# Patient Record
Sex: Male | Born: 1950
Health system: Southern US, Community
[De-identification: ages and names within clinical notes are randomized; demographics above are authoritative.]

## PROBLEM LIST (undated history)

## (undated) DIAGNOSIS — M1990 Unspecified osteoarthritis, unspecified site: Secondary | ICD-10-CM

## (undated) HISTORY — DX: Unspecified osteoarthritis, unspecified site: M19.90

---

## 2019-03-29 ENCOUNTER — Other Ambulatory Visit: Payer: Self-pay

## 2019-03-29 ENCOUNTER — Ambulatory Visit (INDEPENDENT_AMBULATORY_CARE_PROVIDER_SITE_OTHER): Payer: PPO | Admitting: Emergency Medicine

## 2019-03-29 ENCOUNTER — Ambulatory Visit (INDEPENDENT_AMBULATORY_CARE_PROVIDER_SITE_OTHER): Payer: PPO

## 2019-03-29 ENCOUNTER — Encounter: Payer: Self-pay | Admitting: Emergency Medicine

## 2019-03-29 VITALS — BP 168/87 | HR 109 | Temp 98.9°F | Resp 16 | Ht 69.0 in | Wt 156.0 lb

## 2019-03-29 DIAGNOSIS — M7989 Other specified soft tissue disorders: Secondary | ICD-10-CM

## 2019-03-29 DIAGNOSIS — M79642 Pain in left hand: Secondary | ICD-10-CM | POA: Diagnosis not present

## 2019-03-29 DIAGNOSIS — S6722XA Crushing injury of left hand, initial encounter: Secondary | ICD-10-CM

## 2019-03-29 MED ORDER — MELOXICAM 15 MG PO TABS: 15.0000 mg | ORAL_TABLET | Freq: Every day | ORAL | 0 refills | Status: AC

## 2019-03-29 NOTE — Progress Notes (Signed)
Garrett Ross 68 y.o.   Chief Complaint  Patient presents with  . Hand Injury    LEFT x 1 month ago with swelling and pain    HISTORY OF PRESENT ILLNESS: This is a 68 y.o. male sustained injury to left hand about 4 weeks ago complaining of pain and swelling.  No other injuries or significant symptoms.  HPI   Prior to Admission medications   Medication Sig Start Date End Date Taking? Authorizing Provider  ibuprofen (ADVIL) 200 MG tablet Take 200 mg by mouth every 6 (six) hours as needed.   Yes [provider]    No Known Allergies  There are no active problems to display for this patient.   History reviewed. No pertinent past medical history.  History reviewed. No pertinent surgical history.  Social History   Socioeconomic History  . Marital status: Single    Spouse name: Not on file  . Number of children: Not on file  . Years of education: Not on file  . Highest education level: Not on file  Occupational History  . Not on file  Social Needs  . Financial resource strain: Not on file  . Food insecurity    Worry: Not on file    Inability: Not on file  . Transportation needs    Medical: Not on file    Non-medical: Not on file  Tobacco Use  . Smoking status: Current Every Day Smoker    Packs/day: 0.50    Years: 60.00    Pack years: 30.00    Types: Cigarettes  . Smokeless tobacco: Never Used  Substance and Sexual Activity  . Alcohol use: Not on file  . Drug use: Never  . Sexual activity: Not on file  Lifestyle  . Physical activity    Days per week: Not on file    Minutes per session: Not on file  . Stress: Not on file  Relationships  . Social Musician on phone: Not on file    Gets together: Not on file    Attends religious service: Not on file    Active member of club or organization: Not on file    Attends meetings of clubs or organizations: Not on file    Relationship status: Not on file  . Intimate partner violence    Fear of  current or ex partner: Not on file    Emotionally abused: Not on file    Physically abused: Not on file    Forced sexual activity: Not on file  Other Topics Concern  . Not on file  Social History Narrative  . Not on file    History reviewed. No pertinent family history.   Review of Systems  Constitutional: Negative.  Negative for chills and fever.  HENT: Negative.  Negative for congestion and sore throat.   Respiratory: Negative.  Negative for cough and shortness of breath.   Cardiovascular: Negative.  Negative for chest pain and palpitations.  Gastrointestinal: Negative.  Negative for abdominal pain, diarrhea, nausea and vomiting.  Genitourinary: Negative.   Skin: Negative.  Negative for rash.  Neurological: Negative.  Negative for dizziness and headaches.  All other systems reviewed and are negative.  Today's Vitals   03/29/19 1519  BP: (!) 168/87  Pulse: (!) 109  Resp: 16  Temp: 98.9 F (37.2 C)  TempSrc: Oral  SpO2: 98%  Weight: 156 lb (70.8 kg)  Height: 5\' 9"  (1.753 m)   Body mass index is 23.04 kg/m.  Physical Exam Vitals signs reviewed.  Constitutional:      Appearance: Normal appearance.  HENT:     Head: Normocephalic.  Eyes:     Extraocular Movements: Extraocular movements intact.  Neck:     Musculoskeletal: Normal range of motion.  Cardiovascular:     Rate and Rhythm: Normal rate.  Pulmonary:     Effort: Pulmonary effort is normal.  Musculoskeletal:     Comments: Left hand: Positive swelling and tenderness dorsally.  Neurovascularly intact.  Decreased range of motion due to swelling  Skin:    General: Skin is warm and dry.     Capillary Refill: Capillary refill takes less than 2 seconds.  Neurological:     General: No focal deficit present.     Mental Status: He is alert and oriented to person, place, and time.  Psychiatric:        Mood and Affect: Mood normal.        Behavior: Behavior normal.    Dg Hand Complete Left  Result Date:  03/29/2019 CLINICAL DATA:  Crush injury to the left hand EXAM: LEFT HAND - COMPLETE 3+ VIEW COMPARISON:  None. FINDINGS: There is no evidence of fracture or dislocation. There is no evidence of arthropathy or other focal bone abnormality. Soft tissues are unremarkable. IMPRESSION: No fracture or dislocation. Electronically Signed   By: Delbert Phenix M.D.   On: 03/29/2019 15:57     ASSESSMENT & PLAN: Garrett Ross was seen today for hand injury.  Diagnoses and all orders for this visit:  Crushing injury of left hand, initial encounter -     DG Hand Complete Left; Future -     Ambulatory referral to Orthopedic Surgery  Left hand pain -     Ambulatory referral to Orthopedic Surgery -     meloxicam (MOBIC) 15 MG tablet; Take 1 tablet (15 mg total) by mouth daily for 10 days.  Swelling of left hand -     Ambulatory referral to Orthopedic Surgery -     meloxicam (MOBIC) 15 MG tablet; Take 1 tablet (15 mg total) by mouth daily for 10 days.    Patient Instructions       If you have lab work done today you will be contacted with your lab results within the next 2 weeks.  If you have not heard from [place] then please contact us. The fastest way to get your results is to register for My Chart.   IF you received an x-ray today, you will receive an invoice from [facility]. Please contact [facility] at [phone] with questions or concerns regarding your invoice.   IF you received labwork today, you will receive an invoice from [facility]. Please contact [facility] at [phone] with questions or concerns regarding your invoice.   Our billing staff will not be able to assist you with questions regarding bills from these companies.  You will be contacted with the lab results as soon as they are available. The fastest way to get your results is to activate your My Chart account. Instructions are located on the last page of this paperwork. If you have not heard from [place] regarding the  results in 2 weeks, please contact this office.     Crush Injury of the Hand A crush injury of the hand happens when a great amount of force is suddenly applied to your hand. This injury can damage your skin and many parts (structures) in the hand and wrist. Treatment will depend on which parts  are damaged and how bad your injury is. What are the causes? This type of injury might happen:  During a car accident.  If a heavy load falls onto the hand.  If the hand is pulled into a machine during industrial or agricultural work. What are the signs or symptoms? Symptoms will vary depending on which parts of your hand have been injured. Symptoms may include:  Pain in the hand, wrist, or arm. In some cases, the pain can be very bad.  Bleeding at the site of injury.  Tingling, numbness, or loss of feeling (sensation) in part or all of your hand.  Loss of movement in part or all of your hand. How is this treated? Treatment for this condition depends on how bad your crush injury is. Treatment may include:  A thorough cleaning if you have an open wound. This may or may not require surgery.  Having a splint put on your fingers, hand, or forearm.  Medicine to relieve pain.  Antibiotic medicine to prevent infection.  Stitches (sutures) to close open wounds.  One or more surgeries to treat injuries to skin, bones, joints, tendons, ligaments, muscles, nerves, or blood vessels. Follow these instructions at home: If you have a splint:  Wear the splint as told by your doctor. Remove it only as told by your doctor.  Do not put pressure on any part of the splint until it is fully hardened. This may take many hours.  Loosen the splint if your fingers tingle, get numb, or turn cold and blue.  Keep the splint clean.  If the splint is not waterproof: ? Do not let it get wet. ? Cover it with a watertight covering when you take a bath or shower. Wound care   If you have any skin wounds  that were covered with bandages (dressings), follow instructions from your doctor about how to take care of your wounds. Make sure you: ? Wash your hands with soap and water before and after you change your bandage. If you cannot use soap and water, use hand sanitizer. ? Change your bandage as told by your doctor. ? Leave stitches, skin glue, or skin tape (adhesive) strips in place. They may need to stay in place for 2 weeks or longer. If tape strips get loose and curl up, you may trim the loose edges. Do not remove tape strips completely unless your doctor says it is okay.  If you have skin wounds, check them every day for signs of infection. Check for: ? More redness, swelling, or pain. ? More fluid or blood. ? Warmth. ? Pus or a bad smell. Managing pain, stiffness, and swelling   If told, put ice on the injured area. ? Put ice in a plastic bag. ? Place a towel between your skin and the bag. ? Leave the ice on for 20 minutes, 2-3 times a day.  Raise (elevate) the injured area above the level of your heart while you are sitting or lying down. Driving  Ask your doctor: ? If the medicine prescribed to you requires you to avoid driving or using heavy machinery. ? When it is safe to drive if you have a splint on your hand or arm. Activity  Return to your normal activities as told by your doctor. Ask your doctor what activities are safe for you.  Work with a physical therapist (PT) or occupational therapist (OT) as told by your doctor. General instructions  Take over-the-counter and prescription medicines only as told  by your doctor.  If you were prescribed an antibiotic, take it as told by your doctor. Do not stop taking the antibiotic even if you start to feel better.  Do not use any products that contain nicotine or tobacco. These products include cigarettes, e-cigarettes, and chewing tobacco. If you need help quitting, ask your doctor.  Keep all follow-up visits as told by your  doctor. This is important. These include PT and OT visits. Contact a doctor if:  A wound with stitches opens up.  You have more redness, swelling, or pain in your hand.  You have more fluid or blood coming from your hand.  Your hand feels warm to the touch.  You have pus or a bad smell coming from your hand.  You have a fever. Get help right away if:  You suddenly have very bad pain in your hand.  You had feeling in your hand before but you suddenly lose feeling.  Your wrist or hand becomes bent (contracted)without you trying to bend it.  Your symptoms had gotten better and they suddenly get worse.  Your hand or fingers are turning pink or blue. Summary  A crush injury of the hand can damage your skin and many parts (structures) in the hand and wrist.  Symptoms will vary depending on which parts of your hand have been injured.  Treatment for this condition depends on how bad your crush injury is. This information is not intended to replace advice given to you by your health care provider. Make sure you discuss any questions you have with your health care provider. Document Released: 11/19/2009 Document Revised: 05/02/2018 Document Reviewed: 05/02/2018 Elsevier Patient Education  2020 Elsevier Inc.      Agustina Caroli, MD [facility]

## 2019-03-29 NOTE — Patient Instructions (Addendum)
   If you have lab work done today you will be contacted with your lab results within the next 2 weeks.  If you have not heard from us then please contact us. The fastest way to get your results is to register for My Chart.   IF you received an x-ray today, you will receive an invoice from [facility]. Please contact [facility] at [phone] with questions or concerns regarding your invoice.   IF you received labwork today, you will receive an invoice from [facility]. Please contact [facility] at [phone] with questions or concerns regarding your invoice.   Our billing staff will not be able to assist you with questions regarding bills from these companies.  You will be contacted with the lab results as soon as they are available. The fastest way to get your results is to activate your My Chart account. Instructions are located on the last page of this paperwork. If you have not heard from us regarding the results in 2 weeks, please contact this office.     Crush Injury of the Hand A crush injury of the hand happens when a great amount of force is suddenly applied to your hand. This injury can damage your skin and many parts (structures) in the hand and wrist. Treatment will depend on which parts are damaged and how bad your injury is. What are the causes? This type of injury might happen:  During a car accident.  If a heavy load falls onto the hand.  If the hand is pulled into a machine during industrial or agricultural work. What are the signs or symptoms? Symptoms will vary depending on which parts of your hand have been injured. Symptoms may include:  Pain in the hand, wrist, or arm. In some cases, the pain can be very bad.  Bleeding at the site of injury.  Tingling, numbness, or loss of feeling (sensation) in part or all of your hand.  Loss of movement in part or all of your hand. How is this treated? Treatment for this condition depends on how bad  your crush injury is. Treatment may include:  A thorough cleaning if you have an open wound. This may or may not require surgery.  Having a splint put on your fingers, hand, or forearm.  Medicine to relieve pain.  Antibiotic medicine to prevent infection.  Stitches (sutures) to close open wounds.  One or more surgeries to treat injuries to skin, bones, joints, tendons, ligaments, muscles, nerves, or blood vessels. Follow these instructions at home: If you have a splint:  Wear the splint as told by your doctor. Remove it only as told by your doctor.  Do not put pressure on any part of the splint until it is fully hardened. This may take many hours.  Loosen the splint if your fingers tingle, get numb, or turn cold and blue.  Keep the splint clean.  If the splint is not waterproof: ? Do not let it get wet. ? Cover it with a watertight covering when you take a bath or shower. Wound care   If you have any skin wounds that were covered with bandages (dressings), follow instructions from your doctor about how to take care of your wounds. Make sure you: ? Wash your hands with soap and water before and after you change your bandage. If you cannot use soap and water, use hand sanitizer. ? Change your bandage as told by your doctor. ? Leave stitches, skin glue, or skin tape (  adhesive) strips in place. They may need to stay in place for 2 weeks or longer. If tape strips get loose and curl up, you may trim the loose edges. Do not remove tape strips completely unless your doctor says it is okay.  If you have skin wounds, check them every day for signs of infection. Check for: ? More redness, swelling, or pain. ? More fluid or blood. ? Warmth. ? Pus or a bad smell. Managing pain, stiffness, and swelling   If told, put ice on the injured area. ? Put ice in a plastic bag. ? Place a towel between your skin and the bag. ? Leave the ice on for 20 minutes, 2-3 times a day.  Raise (elevate)  the injured area above the level of your heart while you are sitting or lying down. Driving  Ask your doctor: ? If the medicine prescribed to you requires you to avoid driving or using heavy machinery. ? When it is safe to drive if you have a splint on your hand or arm. Activity  Return to your normal activities as told by your doctor. Ask your doctor what activities are safe for you.  Work with a physical therapist (PT) or occupational therapist (OT) as told by your doctor. General instructions  Take over-the-counter and prescription medicines only as told by your doctor.  If you were prescribed an antibiotic, take it as told by your doctor. Do not stop taking the antibiotic even if you start to feel better.  Do not use any products that contain nicotine or tobacco. These products include cigarettes, e-cigarettes, and chewing tobacco. If you need help quitting, ask your doctor.  Keep all follow-up visits as told by your doctor. This is important. These include PT and OT visits. Contact a doctor if:  A wound with stitches opens up.  You have more redness, swelling, or pain in your hand.  You have more fluid or blood coming from your hand.  Your hand feels warm to the touch.  You have pus or a bad smell coming from your hand.  You have a fever. Get help right away if:  You suddenly have very bad pain in your hand.  You had feeling in your hand before but you suddenly lose feeling.  Your wrist or hand becomes bent (contracted)without you trying to bend it.  Your symptoms had gotten better and they suddenly get worse.  Your hand or fingers are turning pink or blue. Summary  A crush injury of the hand can damage your skin and many parts (structures) in the hand and wrist.  Symptoms will vary depending on which parts of your hand have been injured.  Treatment for this condition depends on how bad your crush injury is. This information is not intended to replace advice  given to you by your health care provider. Make sure you discuss any questions you have with your health care provider. Document Released: 11/19/2009 Document Revised: 05/02/2018 Document Reviewed: 05/02/2018 Elsevier Patient Education  2020 Elsevier Inc.  

## 2019-04-05 ENCOUNTER — Ambulatory Visit (INDEPENDENT_AMBULATORY_CARE_PROVIDER_SITE_OTHER): Payer: PPO | Admitting: Orthopaedic Surgery

## 2019-04-05 ENCOUNTER — Encounter: Payer: Self-pay | Admitting: Orthopaedic Surgery

## 2019-04-05 ENCOUNTER — Other Ambulatory Visit: Payer: Self-pay

## 2019-04-05 VITALS — Ht 69.0 in | Wt 156.0 lb

## 2019-04-05 DIAGNOSIS — M25532 Pain in left wrist: Secondary | ICD-10-CM | POA: Diagnosis not present

## 2019-04-05 MED ORDER — PREDNISONE 10 MG (21) PO TBPK: ORAL_TABLET | ORAL | 0 refills | Status: DC

## 2019-04-05 MED ORDER — FEBUXOSTAT 40 MG PO TABS: 40.0000 mg | ORAL_TABLET | Freq: Every day | ORAL | 3 refills | Status: DC

## 2019-04-05 NOTE — Progress Notes (Signed)
Office Visit Note   Patient: Garrett Ross           Date of Birth: 16-Apr-1951           MRN: [id number] Visit Date: 04/05/2019              Requested by: Horald Pollen, MD [place],  [place] [id number] PCP: Patient, No Pcp Per   Assessment & Plan: Visit Diagnoses:  1. Pain in left wrist     Plan: Impression is left hand and wrist swelling.  My impression is that he has a gouty process that is occurring in his wrist.  His x-rays that I reviewed shows generalized osteopenia of the carpal bones which is often seen with gouty arthropathy.  I would like to put him on a prednisone Dosepak as well as Uloric empirically to see if this will help.  Fortunately this is slowly getting better so I think it is fine to continue to give this time and monitor it closely.  Questions encouraged and answered.  Follow-up as needed.  Follow-Up Instructions: Return if symptoms worsen or fail to improve.   Orders:  No orders of the defined types were placed in this encounter.  Meds ordered this encounter  Medications  . predniSONE (STERAPRED UNI-PAK 21 TAB) 10 MG (21) TBPK tablet    Sig: Take as directed    Dispense:  21 tablet    Refill:  0  . febuxostat (ULORIC) 40 MG tablet    Sig: Take 1 tablet (40 mg total) by mouth daily.    Dispense:  30 tablet    Refill:  3      Procedures: No procedures performed   Clinical Data: No additional findings.   Subjective: Chief Complaint  Patient presents with  . Left Hand - Pain    Garrett Ross is a 68 year old gentleman comes in for evaluation of left hand and wrist swelling for the last 1-1/2 to 2 months.  He had an injury originally when he had a tree limb fall across his left wrist.  He states that he started having swelling and numbness in his fingers.  The swelling has improved but he still cannot make a full fist due to tightness from the swelling.  He has a strong history of gout.   Review of Systems  Constitutional: Negative.    All other systems reviewed and are negative.    Objective: Vital Signs: Ht 5\' 9"  (1.753 m)   Wt 156 lb (70.8 kg)   BMI 23.04 kg/m   Physical Exam Vitals signs and nursing note reviewed.  Constitutional:      Appearance: He is well-developed.  HENT:     Head: Normocephalic and atraumatic.  Eyes:     Pupils: Pupils are equal, round, and reactive to light.  Neck:     Musculoskeletal: Neck supple.  Pulmonary:     Effort: Pulmonary effort is normal.  Abdominal:     Palpations: Abdomen is soft.  Musculoskeletal: Normal range of motion.  Skin:    General: Skin is warm.  Neurological:     Mental Status: He is alert and oriented to person, place, and time.  Psychiatric:        Behavior: Behavior normal.        Thought Content: Thought content normal.        Judgment: Judgment normal.     Ortho Exam Left hand and wrist exam shows moderate swelling.  There is no evidence of infection.  There is slight warmth to it.  He has no significant pain with wrist range of motion.  He is not able to make a full composite fist.  He does have swelling on the dorsal aspect of the wrist and hand.  No neurovascular compromise.  Specialty Comments:  No specialty comments available.  Imaging: No results found.   PMFS History: Patient Active Problem List   Diagnosis Date Noted  . Pain in left wrist 04/05/2019   No past medical history on file.  No family history on file.  No past surgical history on file. Social History   Occupational History  . Not on file  Tobacco Use  . Smoking status: Current Every Day Smoker    Packs/day: 0.50    Years: 60.00    Pack years: 30.00    Types: Cigarettes  . Smokeless tobacco: Never Used  Substance and Sexual Activity  . Alcohol use: Not on file  . Drug use: Never  . Sexual activity: Not on file

## 2019-05-15 ENCOUNTER — Ambulatory Visit: Payer: Self-pay | Admitting: Family Medicine

## 2019-06-29 ENCOUNTER — Telehealth: Payer: Self-pay | Admitting: *Deleted

## 2019-06-29 NOTE — Telephone Encounter (Signed)
Schedule awv  

## 2019-07-10 ENCOUNTER — Telehealth: Payer: Self-pay | Admitting: Family Medicine

## 2019-07-10 NOTE — Telephone Encounter (Signed)
History questionnaire submitted on Saturday July 08, 2019 at 5:59:24 PM Questionnaire: History Patient: Garrett Ross [[id number]]   Medical History:  Question: Allergies Response: No Date:   Comments:   Question: Depression Response: No Date:   Comments:   Question: Myocardial infarction Response: No Date:   Comments:   Question: Anemia Response: No Date:   Comments:   Question: Diabetes mellitus Response: No Date:   Comments:   Question: Nerve / muscle disease Response: No Date:   Comments:   Question: Anxiety Response: No Date:   Comments:   Question: Emphysema Response: No Date:   Comments:   Question: Brittle bones Response: No Date:   Comments:   Question: Arthritis Response: Yes Date:   Comments:   Question: Acid reflux Response: No Date:   Comments:   Question: Seizures Response: No Date:   Comments:   Question: Asthma Response: No Date:   Comments:   Question: Glaucoma Response: No Date:   Comments:   Question: Sickle cell anemia Response: No Response Date:   Comments:   Question: Blood transfusion Response: No Date:   Comments:   Question: Heart murmur Response: No Date:   Comments:   Question: Stroke Response: No Date:   Comments:   Question: Cancer Response: No Date:   Comments:   Question: HIV/AIDS Response: No Date:   Comments:   Question: Substance abuse Response: No Date:   Comments:   Question: Cataracts Response: No Date:   Comments:   Question: Hyperlipidemia Response: No Response Date:   Comments:   Question: Thyroid disease Response: No Date:   Comments:   Question: Bleeding problem Response: No Date:   Comments:   Question: High blood pressure Response: No Date:   Comments:   Question: Tuberculosis Response: No Date:   Comments:   Question: Congestive heart failure Response: No Date:   Comments:   Question: Kidney disease Response: No Date:    Comments:   Question: Ulcers Response: No Date:   Comments:   Question: COPD / chronic bronchitis Response: No Date:   Comments:   Question: History of Sleep Apnea Response: No Date:   Comments:   Question: On Home Oxygen Therapy Response: No Date:   Comments:     Surgical History:  Question: Appendectomy Response: No Date:   Comments:   Question: Plastic surgery Response: No Date:   Comments:   Question: Joint replacement Response: No Date:   Comments:   Question: Brain surgery Response: No Date:   Comments:   Question: C-Section Response: No Date:   Comments:   Question: Small intestine surgery Response: No Date:   Comments:   Question: Breast surgery Response: No Date:   Comments:   Question: Eye surgery Response: No Date:   Comments:   Question: Spine surgery Response: No Date:   Comments:   Question: Heart bypass Response: No Date:   Comments:   Question: Fracture surgery Response: No Date:   Comments:   Question: Tubes tied Response: No Date:   Comments:   Question: Gall bladder removal Response: No Date:   Comments:   Question: Hernia repair Response: No Date:   Comments:   Question: Heart valve replacement Response: No Date:   Comments:   Question: Colon / large intestine surgery Response: No Date:   Comments:   Question: Hysterectomy Response: No Date:   Comments:     Family History:   Social History:  Question: Sexually Active Response: No Response  Question: Drug Use Response: Never  Question: Alcohol Use Response: No Response  Question: Tobacco Use (if former smoker, must enter  quit date) Response: Current Everyday Smoker Types: Cigarettes Packs/day: 0.5 Years: 51 Quit Date:  Question: Are you ready to quit smoking? Response: No Response Comments:

## 2019-07-12 ENCOUNTER — Ambulatory Visit: Payer: PPO | Admitting: Family Medicine

## 2019-09-18 ENCOUNTER — Other Ambulatory Visit: Payer: Self-pay

## 2019-09-18 ENCOUNTER — Encounter: Payer: Self-pay | Admitting: Family Medicine

## 2019-09-18 ENCOUNTER — Ambulatory Visit (INDEPENDENT_AMBULATORY_CARE_PROVIDER_SITE_OTHER): Payer: PPO | Admitting: Family Medicine

## 2019-09-18 VITALS — BP 162/88 | HR 102 | Temp 98.3°F | Resp 16 | Ht 69.0 in | Wt 164.0 lb

## 2019-09-18 DIAGNOSIS — Z1211 Encounter for screening for malignant neoplasm of colon: Secondary | ICD-10-CM | POA: Diagnosis not present

## 2019-09-18 DIAGNOSIS — R030 Elevated blood-pressure reading, without diagnosis of hypertension: Secondary | ICD-10-CM | POA: Diagnosis not present

## 2019-09-18 DIAGNOSIS — Z131 Encounter for screening for diabetes mellitus: Secondary | ICD-10-CM

## 2019-09-18 DIAGNOSIS — M1A39X0 Chronic gout due to renal impairment, multiple sites, without tophus (tophi): Secondary | ICD-10-CM

## 2019-09-18 DIAGNOSIS — Z1159 Encounter for screening for other viral diseases: Secondary | ICD-10-CM | POA: Diagnosis not present

## 2019-09-18 DIAGNOSIS — Z91013 Allergy to seafood: Secondary | ICD-10-CM

## 2019-09-18 DIAGNOSIS — Z13228 Encounter for screening for other metabolic disorders: Secondary | ICD-10-CM

## 2019-09-18 DIAGNOSIS — F17200 Nicotine dependence, unspecified, uncomplicated: Secondary | ICD-10-CM

## 2019-09-18 DIAGNOSIS — Z125 Encounter for screening for malignant neoplasm of prostate: Secondary | ICD-10-CM

## 2019-09-18 DIAGNOSIS — Z13220 Encounter for screening for lipoid disorders: Secondary | ICD-10-CM | POA: Diagnosis not present

## 2019-09-18 MED ORDER — EPINEPHRINE 0.3 MG/0.3ML IJ SOAJ: 0.3000 mg | INTRAMUSCULAR | 1 refills | Status: AC | PRN

## 2019-09-18 NOTE — Patient Instructions (Addendum)
  1. Return in one week for a nurse visit for blood pressure check 2. Your gout medication will be prescribed after reviewing labs.  Call us if you do not get results in 3 days.  3. After you get your covid vaccine please upload a copy of the card in your mychart. 4. Your next appointment will be determined by the nurse visit for blood pressure.    If you have lab work done today you will be contacted with your lab results within the next 2 weeks.  If you have not heard from [place] then please contact us. The fastest way to get your results is to register for My Chart.   IF you received an x-ray today, you will receive an invoice from [facility]. Please contact [facility] at [phone] with questions or concerns regarding your invoice.   IF you received labwork today, you will receive an invoice from [facility]. Please contact [facility] at [phone] with questions or concerns regarding your invoice.   Our billing staff will not be able to assist you with questions regarding bills from these companies.  You will be contacted with the lab results as soon as they are available. The fastest way to get your results is to activate your My Chart account. Instructions are located on the last page of this paperwork. If you have not heard from [place] regarding the results in 2 weeks, please contact this office.

## 2019-09-18 NOTE — Progress Notes (Signed)
New Patient Office Visit  Subjective:  Patient ID: Garrett Ross, male    DOB: 05-22-1951  Age: 69 y.o. MRN: [id number]  CC:  Chief Complaint  Patient presents with  . Establish Care  . Gout    per pt Hx     HPI Garrett Ross presents for   Gout Shellfish allergy Patient has a history of gout.  He reports that he has polyarthralgia due to gout.  His last flare was in the left wrist that was in 2020 He was diagnosed and was managed by Rheumatology He is avoid triggers for gout mostly beef He is allergic to shellfish  Tobacco use Patient has a 40 year of smoking tobacco He denies morning cough, weight loss, hemoptysis He denies family history of lung cancer He has no wheezing with activity No family history of heart attack or stroke His mother had heart disease.   He is waiting for the [employer] vaccine for covid. He plans to get the covid vaccine.   Colon Cancer Screening He has never had a colonoscopy He denies blood in his stool, unexpected weight loss or pain with defecation No rectal itching He is a smoker He does not have a family history of colon cancer   No past medical history on file.  No past surgical history on file.  No family history on file.  Social History   Socioeconomic History  . Marital status: Single    Spouse name: Not on file  . Number of children: Not on file  . Years of education: Not on file  . Highest education level: Not on file  Occupational History  . Not on file  Tobacco Use  . Smoking status: Current Every Day Smoker    Packs/day: 0.50    Years: 60.00    Pack years: 30.00    Types: Cigarettes  . Smokeless tobacco: Never Used  Substance and Sexual Activity  . Alcohol use: Not on file  . Drug use: Never  . Sexual activity: Not on file  Other Topics Concern  . Not on file  Social History Narrative  . Not on file   Social Determinants of Health   Financial Resource Strain:   . Difficulty of Paying  Living Expenses:   Food Insecurity:   . Worried About Programme researcher, broadcasting/film/video in the Last Year:   . Barista in the Last Year:   Transportation Needs:   . Freight forwarder (Medical):   Marland Kitchen Lack of Transportation (Non-Medical):   Physical Activity:   . Days of Exercise per Week:   . Minutes of Exercise per Session:   Stress:   . Feeling of Stress :   Social Connections:   . Frequency of Communication with Friends and Family:   . Frequency of Social Gatherings with Friends and Family:   . Attends Religious Services:   . Active Member of Clubs or Organizations:   . Attends Banker Meetings:   Marland Kitchen Marital Status:   Intimate Partner Violence:   . Fear of Current or Ex-Partner:   . Emotionally Abused:   Marland Kitchen Physically Abused:   . Sexually Abused:     ROS Review of Systems Review of Systems  Constitutional: Negative for activity change, appetite change, chills and fever.  HENT: Negative for congestion, nosebleeds, trouble swallowing and voice change.   Respiratory: Negative for cough, shortness of breath and wheezing.   Gastrointestinal: Negative for diarrhea, nausea and vomiting.  Genitourinary:  Negative for difficulty urinating, dysuria, flank pain and hematuria.  Musculoskeletal: Negative for back pain, joint swelling and neck pain.  Neurological: Negative for dizziness, speech difficulty, light-headedness and numbness.  See HPI. All other review of systems negative.   Objective:   Today's Vitals: BP (!) 162/88   Pulse (!) 102   Temp 98.3 F (36.8 C) (Temporal)   Resp 16   Ht 5\' 9"  (1.753 m) Comment: per pt  Wt 164 lb (74.4 kg)   SpO2 97%   BMI 24.22 kg/m   Physical Exam Physical Exam  Constitutional: Oriented to person, place, and time. Appears well-developed and well-nourished.  HENT:  Head: Normocephalic and atraumatic.  Eyes: Conjunctivae and EOM are normal.  Neck: supple, nontender Cardiovascular: Normal rate, regular rhythm, normal heart  sounds and intact distal pulses.  No murmur heard. Pulmonary/Chest: Effort normal and breath sounds normal. No stridor. No respiratory distress. Has no wheezes.  Abdomen: nondistended, normoactive bs, soft, nontender Neurological: Is alert and oriented to person, place, and time.  Skin: Skin is warm. Capillary refill takes less than 2 seconds.  Psychiatric: Has a normal mood and affect. Behavior is normal. Judgment and thought content normal.   Assessment & Plan:   Problem List Items Addressed This Visit    None    Visit Diagnoses    Screening for cholesterol level    -  Primary   Relevant Orders   Comprehensive metabolic panel   Lipid panel   Encounter for screening for other metabolic disorders    - will monitor, bp elevated today but no history of hypertension    Relevant Orders   Comprehensive metabolic panel   Lipid panel   Encounter for hepatitis C screening test for low risk patient       Screening for colon cancer    - Patient declines colonoscopy, understanding that it's the better test for detection of colon cancer, but will do COLOGUARD. Use explained.     Relevant Orders   Cologuard   Screening for prostate cancer     Will screen today with blood test Pt asymptomatic   Relevant Orders   PSA   Shellfish allergy       Relevant Medications   EPINEPHrine 0.3 mg/0.3 mL IJ SOAJ injection   Tobacco use disorder    -  Smoking cessation advised, discussed Chantix   Relevant Orders   Lipid panel   PSA   Screening for diabetes mellitus       Relevant Orders   Comprehensive metabolic panel    Elevated blood pressure without diagnosis of hypertension  - will recheck in one week since pt seems to have [place] Hypertension  Will have a nurse visit for bp check  Gout affecting multiple sites: discussed that will review renal function and plan to send in allopurinol and mitigare Since his current meds are not covered.  Next steps:  1. Will check labs before refill gout  meds 2. Follow up in 1 week for nurse visit for bp check  3. Follow up with be determined by the bp recheck   Outpatient Encounter Medications as of 09/18/2019  Medication Sig  . ibuprofen (ADVIL) 200 MG tablet Take 200 mg by mouth every 6 (six) hours as needed.  11/18/2019 EPINEPHrine 0.3 mg/0.3 mL IJ SOAJ injection Inject 0.3 mLs (0.3 mg total) into the muscle as needed for anaphylaxis.  . febuxostat (ULORIC) 40 MG tablet Take 1 tablet (40 mg total) by mouth daily. (Patient not  taking: Reported on 09/18/2019)  . predniSONE (STERAPRED UNI-PAK 21 TAB) 10 MG (21) TBPK tablet Take as directed (Patient not taking: Reported on 09/18/2019)   No facility-administered encounter medications on file as of 09/18/2019.    Follow-up: No follow-ups on file.   Forrest Moron, MD

## 2019-09-19 ENCOUNTER — Telehealth: Payer: Self-pay | Admitting: *Deleted

## 2019-09-19 LAB — COMPREHENSIVE METABOLIC PANEL
ALT: 42 IU/L (ref 0–44)
AST: 36 IU/L (ref 0–40)
Albumin/Globulin Ratio: 1.2 (ref 1.2–2.2)
Albumin: 4.3 g/dL (ref 3.8–4.8)
Alkaline Phosphatase: 102 IU/L (ref 39–117)
BUN/Creatinine Ratio: 15 (ref 10–24)
BUN: 15 mg/dL (ref 8–27)
Bilirubin Total: 0.3 mg/dL (ref 0.0–1.2)
CO2: 19 mmol/L — ABNORMAL LOW (ref 20–29)
Calcium: 9.4 mg/dL (ref 8.6–10.2)
Chloride: 100 mmol/L (ref 96–106)
Creatinine, Ser: 0.98 mg/dL (ref 0.76–1.27)
GFR calc Af Amer: 92 mL/min/{1.73_m2} (ref >59)
GFR calc non Af Amer: 79 mL/min/{1.73_m2} (ref >59)
Globulin, Total: 3.7 g/dL (ref 1.5–4.5)
Glucose: 102 mg/dL — ABNORMAL HIGH (ref 65–99)
Potassium: 4.9 mmol/L (ref 3.5–5.2)
Sodium: 139 mmol/L (ref 134–144)
Total Protein: 8 g/dL (ref 6.0–8.5)

## 2019-09-19 LAB — LIPID PANEL
Chol/HDL Ratio: 2.6 ratio (ref 0.0–5.0)
Cholesterol, Total: 132 mg/dL (ref 100–199)
HDL: 50 mg/dL (ref >39)
LDL Chol Calc (NIH): 64 mg/dL (ref 0–99)
Triglycerides: 97 mg/dL (ref 0–149)
VLDL Cholesterol Cal: 18 mg/dL (ref 5–40)

## 2019-09-19 LAB — HCV INTERPRETATION

## 2019-09-19 LAB — HCV AB W/RFLX TO VERIFICATION: HCV Ab: <0.1 s/co ratio (ref 0.0–0.9)

## 2019-09-19 LAB — PSA: Prostate Specific Ag, Serum: 0.6 ng/mL (ref 0.0–4.0)

## 2019-09-19 NOTE — Telephone Encounter (Signed)
Re faxed Cologuard request because the lab states date of birth was incorrect. The requisition shows the dob is correct when it was faxed. Confirmation page 10:08 am.

## 2019-09-26 ENCOUNTER — Ambulatory Visit: Payer: PPO

## 2019-09-28 ENCOUNTER — Other Ambulatory Visit: Payer: Self-pay

## 2019-09-28 ENCOUNTER — Ambulatory Visit (INDEPENDENT_AMBULATORY_CARE_PROVIDER_SITE_OTHER): Payer: PPO | Admitting: Family Medicine

## 2019-09-28 VITALS — BP 142/101

## 2019-09-28 DIAGNOSIS — R030 Elevated blood-pressure reading, without diagnosis of hypertension: Secondary | ICD-10-CM

## 2019-09-28 NOTE — Progress Notes (Signed)
Pt was instructed to make follow up appt with pcp for high bp readings

## 2019-10-09 ENCOUNTER — Telehealth (INDEPENDENT_AMBULATORY_CARE_PROVIDER_SITE_OTHER): Payer: PPO | Admitting: Family Medicine

## 2019-10-09 ENCOUNTER — Other Ambulatory Visit: Payer: Self-pay

## 2019-10-09 NOTE — Progress Notes (Signed)
ERRONEOUS

## 2019-10-09 NOTE — Patient Instructions (Signed)
° ° ° °  If you have lab work done today you will be contacted with your lab results within the next 2 weeks.  If you have not heard from us then please contact us. The fastest way to get your results is to register for My Chart. ° ° °IF you received an x-ray today, you will receive an invoice from [facility]. Please contact [facility] at [phone] with questions or concerns regarding your invoice.  ° °IF you received labwork today, you will receive an invoice from [facility]. Please contact [facility] at [phone] with questions or concerns regarding your invoice.  ° °Our billing staff will not be able to assist you with questions regarding bills from these companies. ° °You will be contacted with the lab results as soon as they are available. The fastest way to get your results is to activate your My Chart account. Instructions are located on the last page of this paperwork. If you have not heard from us regarding the results in 2 weeks, please contact this office. °  ° ° ° °

## 2019-10-16 ENCOUNTER — Encounter: Payer: Self-pay | Admitting: Family Medicine

## 2019-10-30 ENCOUNTER — Telehealth: Payer: Self-pay | Admitting: *Deleted

## 2019-10-30 NOTE — Telephone Encounter (Signed)
Schedule AWV.  

## 2020-05-10 IMAGING — DX DG HAND COMPLETE 3+V*L*
3 series · 3 of 3 positions shown · non-contrast
Comparison: None.

CLINICAL DATA: Crush injury to the left hand

EXAM:
LEFT HAND - COMPLETE 3+ VIEW

[hand pa]
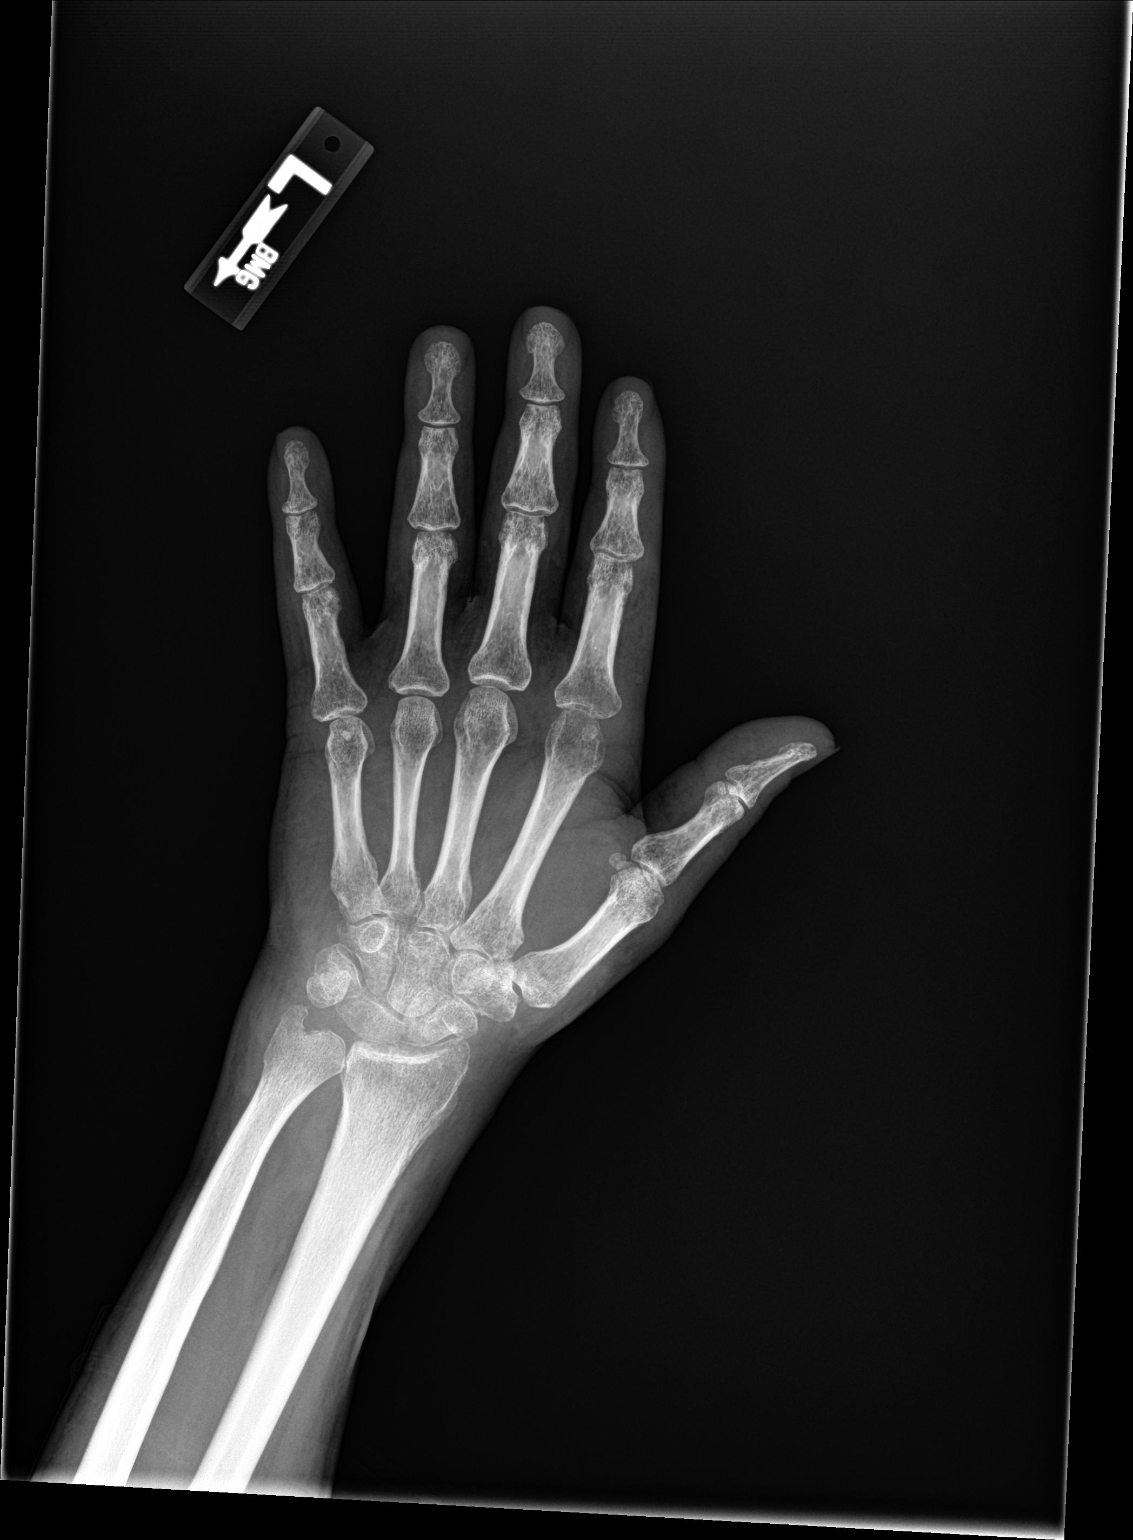

[hand obl]
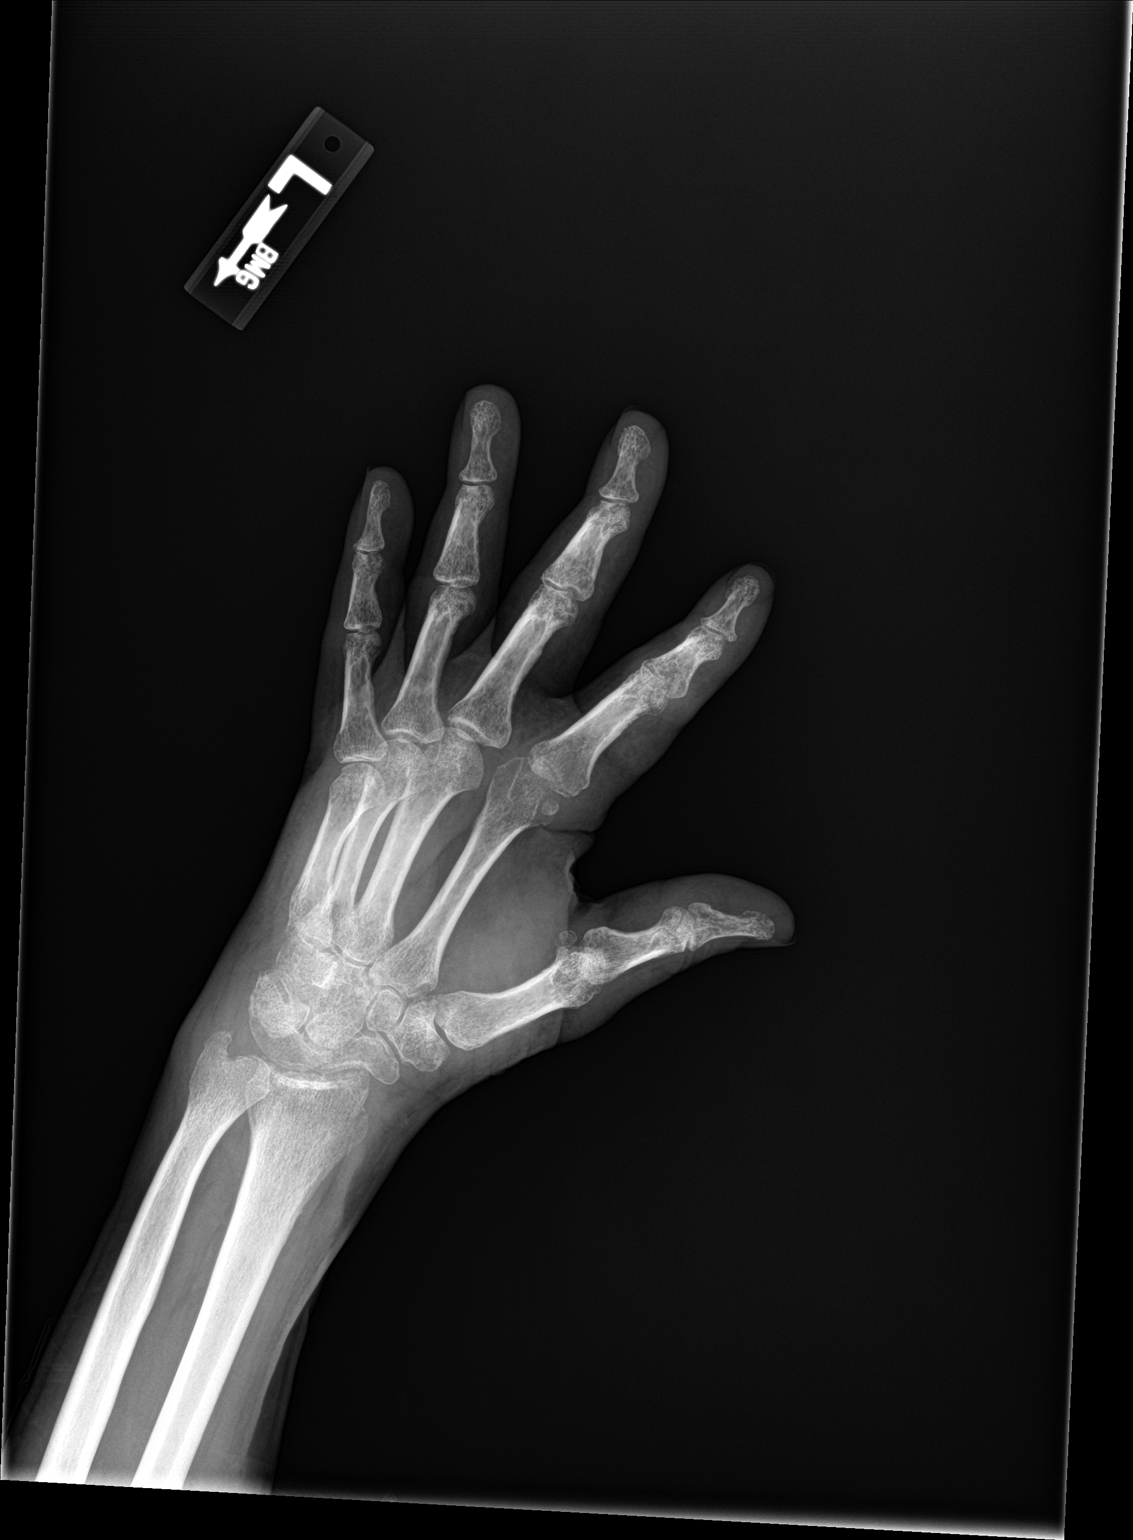

[hand lat]
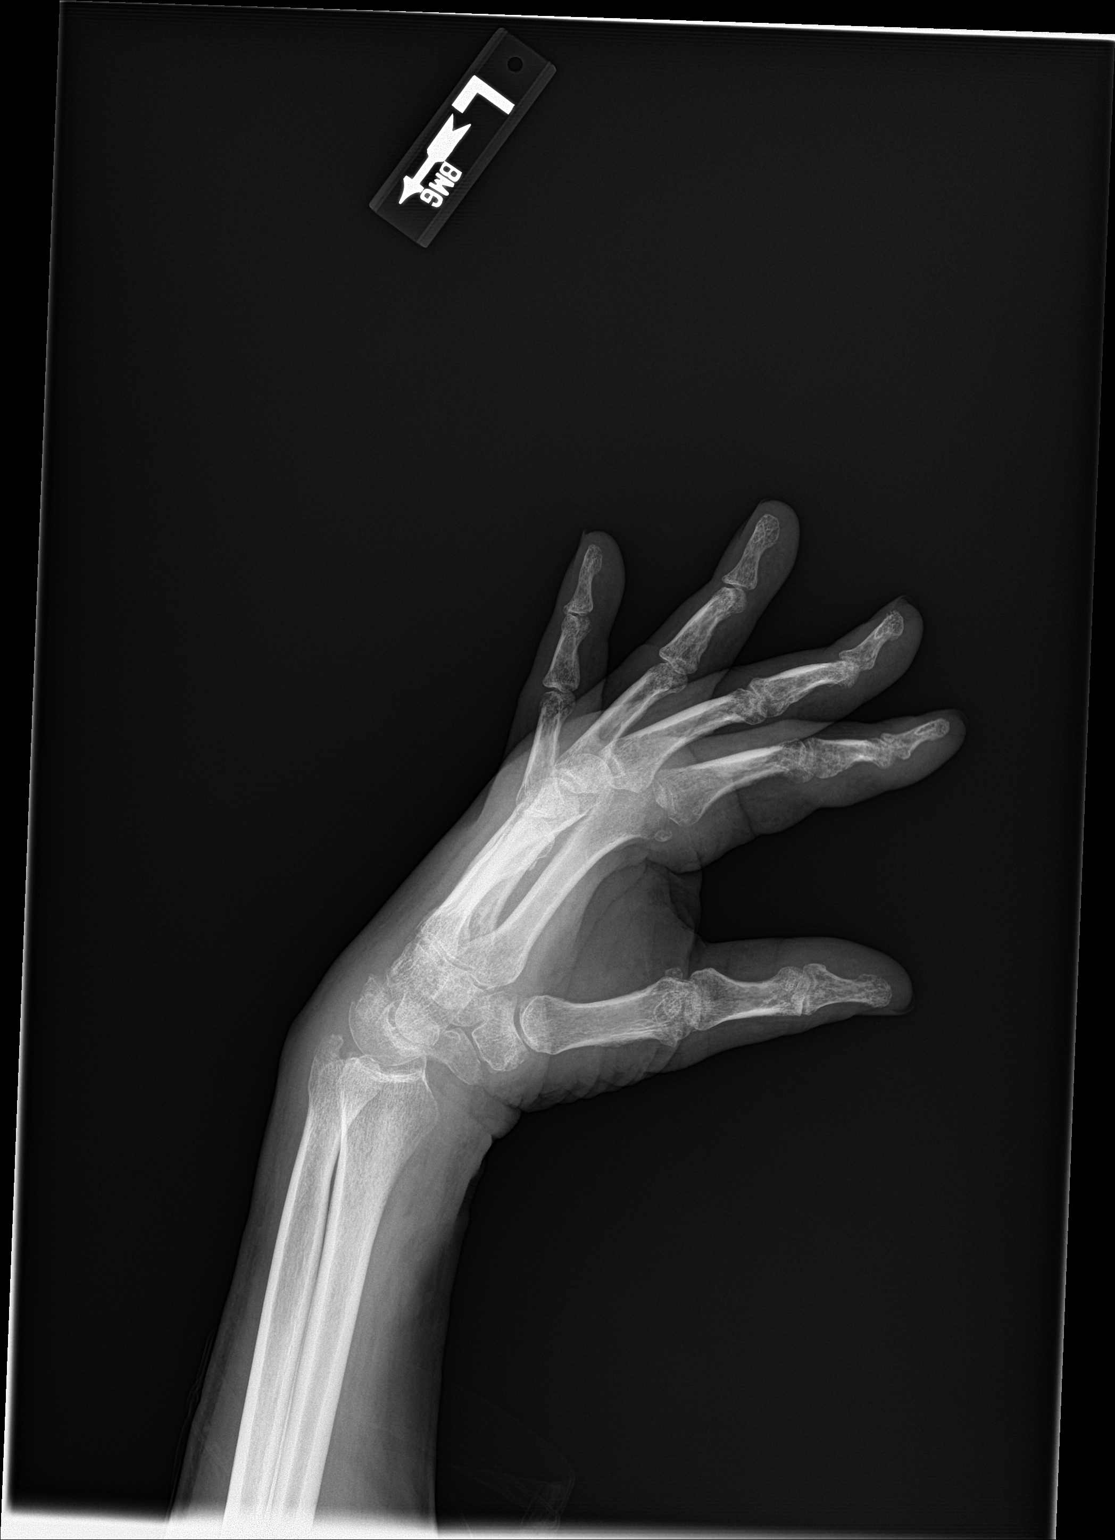

[3 of 3 positions shown; findings below may reference images not displayed]

FINDINGS: There is no evidence of fracture or dislocation. There is no
evidence of arthropathy or other focal bone abnormality. Soft
tissues are unremarkable.
IMPRESSION: No fracture or dislocation.

## 2022-05-20 ENCOUNTER — Ambulatory Visit: Payer: PPO | Admitting: Student
